# Patient Record
Sex: Female | Born: 2000 | Race: White | Hispanic: No | Marital: Single | Smoking: Never smoker
Health system: Southern US, Community
[De-identification: ages and names within clinical notes are randomized; demographics above are authoritative.]

## PROBLEM LIST (undated history)

## (undated) DIAGNOSIS — N289 Disorder of kidney and ureter, unspecified: Secondary | ICD-10-CM

## (undated) DIAGNOSIS — U071 COVID-19: Secondary | ICD-10-CM

## (undated) HISTORY — PX: WISDOM TOOTH EXTRACTION: SHX21

---
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
## 2019-07-04 ENCOUNTER — Encounter (HOSPITAL_COMMUNITY): Payer: Self-pay | Admitting: Emergency Medicine

## 2019-07-04 ENCOUNTER — Other Ambulatory Visit: Payer: Self-pay

## 2019-07-04 ENCOUNTER — Emergency Department (HOSPITAL_COMMUNITY)
Admission: EM | Admit: 2019-07-04 | Discharge: 2019-07-04 | Disposition: A | Discharge status: Home / Self Care | Payer: 59 | Attending: Emergency Medicine | Admitting: Emergency Medicine

## 2019-07-04 ENCOUNTER — Emergency Department (HOSPITAL_COMMUNITY): Payer: 59

## 2019-07-04 DIAGNOSIS — R0781 Pleurodynia: Secondary | ICD-10-CM | POA: Insufficient documentation

## 2019-07-04 DIAGNOSIS — R0602 Shortness of breath: Secondary | ICD-10-CM | POA: Diagnosis present

## 2019-07-04 DIAGNOSIS — Z793 Long term (current) use of hormonal contraceptives: Secondary | ICD-10-CM | POA: Insufficient documentation

## 2019-07-04 DIAGNOSIS — U071 COVID-19: Secondary | ICD-10-CM | POA: Insufficient documentation

## 2019-07-04 HISTORY — DX: COVID-19: U07.1

## 2019-07-04 HISTORY — DX: Disorder of kidney and ureter, unspecified: N28.9

## 2019-07-04 LAB — BASIC METABOLIC PANEL
Anion gap: 9 (ref 5–15)
BUN: 7 mg/dL (ref 6–20)
CO2: 23 mmol/L (ref 22–32)
Calcium: 8.9 mg/dL (ref 8.9–10.3)
Chloride: 105 mmol/L (ref 98–111)
Creatinine, Ser: 1.01 mg/dL — ABNORMAL HIGH (ref 0.44–1.00)
GFR calc Af Amer: >60 mL/min (ref >60)
GFR calc non Af Amer: >60 mL/min (ref >60)
Glucose, Bld: 93 mg/dL (ref 70–99)
Potassium: 4.2 mmol/L (ref 3.5–5.1)
Sodium: 137 mmol/L (ref 135–145)

## 2019-07-04 LAB — CBC
HCT: 45 % (ref 36.0–46.0)
Hemoglobin: 15.2 g/dL — ABNORMAL HIGH (ref 12.0–15.0)
MCH: 29.2 pg (ref 26.0–34.0)
MCHC: 33.8 g/dL (ref 30.0–36.0)
MCV: 86.4 fL (ref 80.0–100.0)
Platelets: 246 10*3/uL (ref 150–400)
RBC: 5.21 MIL/uL — ABNORMAL HIGH (ref 3.87–5.11)
RDW: 12.5 % (ref 11.5–15.5)
WBC: 2.9 10*3/uL — ABNORMAL LOW (ref 4.0–10.5)
nRBC: 0 % (ref 0.0–0.2)

## 2019-07-04 LAB — D-DIMER, QUANTITATIVE: D-Dimer, Quant: 1.32 ug/mL-FEU — ABNORMAL HIGH (ref 0.00–0.50)

## 2019-07-04 LAB — I-STAT BETA HCG BLOOD, ED (MC, WL, AP ONLY): I-stat hCG, quantitative: <5 m[IU]/mL (ref <5)

## 2019-07-04 MED ORDER — SODIUM CHLORIDE 0.9% FLUSH: 3.0000 mL | Freq: Once | INTRAVENOUS | Status: DC

## 2019-07-04 MED ORDER — IOHEXOL 350 MG/ML SOLN
80.0000 mL | Freq: Once | INTRAVENOUS | Status: AC | PRN
  Administered 2019-07-04: 18:00:00 80 mL via INTRAVENOUS

## 2019-07-04 NOTE — ED Provider Notes (Signed)
[facility] EMERGENCY DEPARTMENT Provider Note   CSN: [id number] Arrival date & time: 07/04/19  1300     History   Chief Complaint Chief Complaint  Patient presents with   covid/CP   Shortness of Breath    HPI Kristy Mueller is a 18 y.o. female with no significant past medical history presents to the ED with complaints of intermittent sharp chest pain associated with inspiration.  She has been symptomatic of COVID-19 x3 days with reported cough and chest pains.  She spoke with her on-call doctor at her PCPs office today and they instructed her to go to the ED for evaluation given her pleuritic chest pain.  She states that she has been taking Robitussin, Mucinex, and Tylenol, with good effect.  She denies any history of clots, clotting disorder, recent immobilization or travel, leg swelling, fevers or chills, or respiratory distress.  She does however take oral contraceptives with estrogen.      HPI  Past Medical History:  Diagnosis Date   COVID-19    Kidney disorder     Patient Active Problem List   Diagnosis Date Noted   Influenza due to identified novel influenza A virus with other respiratory manifestations 06/06/2016    Past Surgical History:  Procedure Laterality Date   WISDOM TOOTH EXTRACTION       OB History   No obstetric history on file.      Home Medications    Prior to Admission medications   Medication Sig Start Date End Date Taking? Authorizing Provider  Levonorgestrel-Ethinyl Estrad (ALTAVERA PO) Take 1 tablet by mouth daily.    Yes [provider]  oxybutynin (DITROPAN-XL) 5 MG 24 hr tablet Take 5 mg by mouth daily.   Yes [provider]  tamsulosin (FLOMAX) 0.4 MG CAPS capsule Take 0.4 mg by mouth daily. 05/31/19  Yes [provider]    Family History No family history on file.  Social History Social History   Tobacco Use   Smoking status: Never Smoker   Smokeless tobacco: Never Used   Substance Use Topics   Alcohol use: Never    Frequency: Never   Drug use: Never     Allergies   Tape   Review of Systems Review of Systems  All other systems reviewed and are negative.    Physical Exam Updated Vital Signs BP (!) 118/97    Pulse 100    Temp (!) 97.4 F (36.3 C) (Oral)    Resp 18    SpO2 97%   Physical Exam Vitals signs and nursing note reviewed. Exam conducted with a chaperone present.  Constitutional:      Appearance: Normal appearance.  HENT:     Head: Normocephalic and atraumatic.  Eyes:     General: No scleral icterus.    Conjunctiva/sclera: Conjunctivae normal.  Neck:     Musculoskeletal: Normal range of motion and neck supple.  Cardiovascular:     Rate and Rhythm: Normal rate and regular rhythm.     Pulses: Normal pulses.     Heart sounds: Normal heart sounds.  Pulmonary:     Effort: Pulmonary effort is normal. No respiratory distress.     Breath sounds: Normal breath sounds. No wheezing or rales.  Abdominal:     General: Abdomen is flat. There is no distension.     Palpations: Abdomen is soft.     Tenderness: There is no abdominal tenderness. There is no guarding.  Skin:    General: Skin  is dry.  Neurological:     Mental Status: She is alert.     GCS: GCS eye subscore is 4. GCS verbal subscore is 5. GCS motor subscore is 6.  Psychiatric:        Mood and Affect: Mood normal.        Behavior: Behavior normal.        Thought Content: Thought content normal.      ED Treatments / Results  Labs (all labs ordered are listed, but only abnormal results are displayed) Labs Reviewed  BASIC METABOLIC PANEL - Abnormal; Notable for the following components:      Result Value   Creatinine, Ser 1.01 (*)    All other components within normal limits  CBC - Abnormal; Notable for the following components:   WBC 2.9 (*)    RBC 5.21 (*)    Hemoglobin 15.2 (*)    All other components within normal limits  D-DIMER, QUANTITATIVE (NOT AT [facility]) -  Abnormal; Notable for the following components:   D-Dimer, Quant 1.32 (*)    All other components within normal limits  I-STAT BETA HCG BLOOD, ED ([facility], [facility], [facility] ONLY)    EKG EKG Interpretation  Date/Time:  Sunday July 04 2019 13:13:46 EST Ventricular Rate:  111 PR Interval:  120 QRS Duration: 72 QT Interval:  306 QTC Calculation: 416 R Axis:   87 Text Interpretation: Sinus tachycardia Otherwise normal ECG Confirmed by Bethann Berkshire [phone]) on 07/04/2019 4:38:05 PM   Radiology Ct Angio Chest Pe W And/or Wo Contrast  Result Date: 07/04/2019 CLINICAL DATA:  Chest pain with deep inspiration, pre syncopal feeling, shortness of breath, nonproductive cough, elevated D-dimer, COVID-19 positive, intermediate clinical probability for pulmonary embolism EXAM: CT ANGIOGRAPHY CHEST WITH CONTRAST TECHNIQUE: Multidetector CT imaging of the chest was performed using the standard protocol during bolus administration of intravenous contrast. Multiplanar CT image reconstructions and MIPs were obtained to evaluate the vascular anatomy. CONTRAST:  63mL OMNIPAQUE IOHEXOL 350 MG/ML SOLN IV COMPARISON:  None FINDINGS: Cardiovascular: Aorta normal caliber without aneurysm or dissection. Heart unremarkable. No pericardial effusion. Pulmonary arteries adequately opacified and patent. No evidence of pulmonary embolism. Mediastinum/Nodes: Base of cervical region normal appearance. Residual thymic tissue in anterior mediastinum. Esophagus unremarkable. No thoracic adenopathy. Lungs/Pleura: Patchy BILATERAL ground-glass pulmonary infiltrates consistent with pneumonia and history of COVID-19. No pleural effusion or pneumothorax. Upper Abdomen: Mild splenic prominence. Remaining visualized upper abdomen unremarkable. Musculoskeletal: No acute osseous findings. Review of the MIP images confirms the above findings. IMPRESSION: No evidence of pulmonary embolism. Patchy BILATERAL ground-glass pulmonary infiltrates consistent with  pneumonia and history of COVID-19. Electronically Signed   By: Ulyses Southward M.D.   On: 07/04/2019 18:23   Dg Chest Portable 1 View  Result Date: 07/04/2019 CLINICAL DATA:  Chest pain, shortness of breath and positive COVID-19 test. EXAM: PORTABLE CHEST 1 VIEW COMPARISON:  06/04/2016 FINDINGS: The cardiac silhouette, mediastinal and hilar contours are normal. The lungs are clear. No infiltrates or effusions. The bony thorax is intact. IMPRESSION: No acute pulmonary findings. Electronically Signed   By: Rudie Meyer M.D.   On: 07/04/2019 14:35    Procedures Procedures (including critical care time)  Medications Ordered in ED Medications  sodium chloride flush (NS) 0.9 % injection 3 mL (has no administration in time range)  iohexol (OMNIPAQUE) 350 MG/ML injection 80 mL (80 mLs Intravenous Contrast Given 07/04/19 1753)     Initial Impression / Assessment and Plan / ED Course  I have reviewed the  triage vital signs and the nursing notes.  Pertinent labs & imaging results that were available during my care of the patient were reviewed by me and considered in my medical decision making (see chart for details).        DG chest was obtained and personally reviewed which demonstrates no evidence of focal consolidation or atypical pneumonia.  Also no evidence of a pneumothorax that might explain her pleuritic chest pain.  While she is low risk for PE, she is not PERC negative as she takes oral contraceptives.  She also tested positive for COVID-19 which is a prothrombotic infection.  Will obtain D-dimer and if positive will proceed with CTA.  The D-dimer might be elevated due to the COVID-19 infection, but also might not.  Patient had an elevated D-dimer so we proceeded with CTA.  CTA was reviewed personally and by radiologist and demonstrates no evidence of a pulmonary embolism, but rather evidence of patchy infiltrates consistent with a COVID-19 pneumonia.  Given that patient continues to be  neither tachycardic, tachypneic, or hypoxic, it is reasonable for her to be discharged home.  Continue over-the-counter medications for symptomatic relief, as needed.  Emphasized the importance of increased oral hydration.  Please return to the ED or seek medical attention immediately should you develop any difficulty breathing, new or worsening chest pain, dizziness or lightheadedness, uncontrolled nausea or vomiting, inability to tolerate food or liquid by mouth, fevers or chills uncontrolled by Tylenol, or any other new or worsening symptoms.   Final Clinical Impressions(s) / ED Diagnoses   Final diagnoses:  COVID-19    ED Discharge Orders    None       Elvera MariaGreen, Arlester Keehan L, PA-C 07/04/19 Kerrin [id number]    Zammit, Joseph, MD 07/05/19 1002

## 2019-07-04 NOTE — ED Notes (Signed)
Patient transported to radiology

## 2019-07-04 NOTE — ED Notes (Signed)
Provider at bedside

## 2019-07-04 NOTE — Discharge Instructions (Signed)
You have patchy infiltrates on your CT scan that are consistent with a atypical pneumonia such as 1 caused by COVID-19.  Please continue over-the-counter medications for symptomatic relief, as needed.  Use Tylenol or ibuprofen should you develop any fever or chills.  Please return to the ED or seek medical attention immediately should you  develop any difficulty breathing, new or worsening chest pain, dizziness or lightheadedness, uncontrolled nausea or vomiting, inability to tolerate food or liquid by mouth, fevers or chills uncontrolled by Tylenol, or any other new or worsening symptoms.   If you live with, or provide care at home for, a person confirmed to have, or being evaluated for, COVID-19 infection please follow these guidelines to prevent infection:  Follow healthcare providers instructions Make sure that you understand and can help the patient follow any healthcare provider instructions for all care.  Provide for the patients basic needs You should help the patient with basic needs in the home and provide support for getting groceries, prescriptions, and other personal needs.  Monitor the patients symptoms If they are getting sicker, call his or her medical provider a  This will help the healthcare providers office take steps to keep other people from getting infected. Ask the healthcare provider to call the local or state health department.  Limit the number of people who have contact with the patient If possible, have only one caregiver for the patient. Other household members should stay in another home or place of residence. If this is not possible, they should stay in another room, or be separated from the patient as much as possible. Use a separate bathroom, if available. Restrict visitors who do not have an essential need to be in the home.  Keep older adults, very young children, and other sick people away from the patient Keep older adults, very young children, and  those who have compromised immune systems or chronic health conditions away from the patient. This includes people with chronic heart, lung, or kidney conditions, diabetes, and cancer.  Ensure good ventilation Make sure that shared spaces in the home have good air flow, such as from an air conditioner or an opened window, weather permitting.  Wash your hands often Wash your hands often and thoroughly with soap and water for at least 20 seconds. You can use an alcohol based hand sanitizer if soap and water are not available and if your hands are not visibly dirty. Avoid touching your eyes, nose, and mouth with unwashed hands. Use disposable paper towels to dry your hands. If not available, use dedicated cloth towels and replace them when they become wet.  Wear a facemask and gloves Wear a disposable facemask at all times in the room and gloves when you touch or have contact with the patients blood, body fluids, and/or secretions or excretions, such as sweat, saliva, sputum, nasal mucus, vomit, urine, or feces.  Ensure the mask fits over your nose and mouth tightly, and do not touch it during use. Throw out disposable facemasks and gloves after using them. Do not reuse. Wash your hands immediately after removing your facemask and gloves. If your personal clothing becomes contaminated, carefully remove clothing and launder. Wash your hands after handling contaminated clothing. Place all used disposable facemasks, gloves, and other waste in a lined container before disposing them with other household waste. Remove gloves and wash your hands immediately after handling these items.  Do not share dishes, glasses, or other household items with the patient Avoid sharing household items.  You should not share dishes, drinking glasses, cups, eating utensils, towels, bedding, or other items After the person uses these items, you should wash them thoroughly with soap and water.  Wash laundry  thoroughly Immediately remove and wash clothes or bedding that have blood, body fluids, and/or secretions or excretions, such as sweat, saliva, sputum, nasal mucus, vomit, urine, or feces, on them. Wear gloves when handling laundry from the patient. Read and follow directions on labels of laundry or clothing items and detergent. In general, wash and dry with the warmest temperatures recommended on the label.  Clean all areas the individual has used often Clean all touchable surfaces, such as counters, tabletops, doorknobs, bathroom fixtures, toilets, phones, keyboards, tablets, and bedside tables, every day. Also, clean any surfaces that may have blood, body fluids, and/or secretions or excretions on them. Wear gloves when cleaning surfaces the patient has come in contact with. Use a diluted bleach solution (e.g., dilute bleach with 1 part bleach and 10 parts water) or a household disinfectant with a label that says EPA-registered for coronaviruses. To make a bleach solution at home, add 1 tablespoon of bleach to 1 quart (4 cups) of water. For a larger supply, add  cup of bleach to 1 gallon (16 cups) of water. Read labels of cleaning products and follow recommendations provided on product labels. Labels contain instructions for safe and effective use of the cleaning product including precautions you should take when applying the product, such as wearing gloves or eye protection and making sure you have good ventilation during use of the product. Remove gloves and wash hands immediately after cleaning.  Monitor yourself for signs and symptoms of illness Caregivers and household members are considered close contacts, should monitor their health, and will be asked to limit movement outside of the home to the extent possible. Follow the monitoring steps for close contacts listed on the symptom monitoring form.   ? If you have additional questions, contact your local health department or call the  epidemiologist on call at [phone] (available 24/7). ? This guidance is subject to change. For the most up-to-date guidance from [facility], please refer to their website: [email or web address]

## 2019-07-04 NOTE — ED Triage Notes (Signed)
+   COVID results today.  Pt states she feels like she is going to pass out.  Reports this morning she started to have chest pain with deep inspiration, SOB, non-productive cough.  Denies fever.

## 2019-07-04 NOTE — ED Notes (Signed)
Called lab able to add D-dimer to available blood.  

## 2019-07-04 NOTE — ED Notes (Signed)
Patient returned from CT

## 2019-12-24 IMAGING — DX DG CHEST 1V PORT
1 series · 1 of 1 positions shown · non-contrast
Comparison: 06/04/2016

CLINICAL DATA: Chest pain, shortness of breath and positive
[id number] test.

EXAM:
PORTABLE CHEST 1 VIEW

[chest]
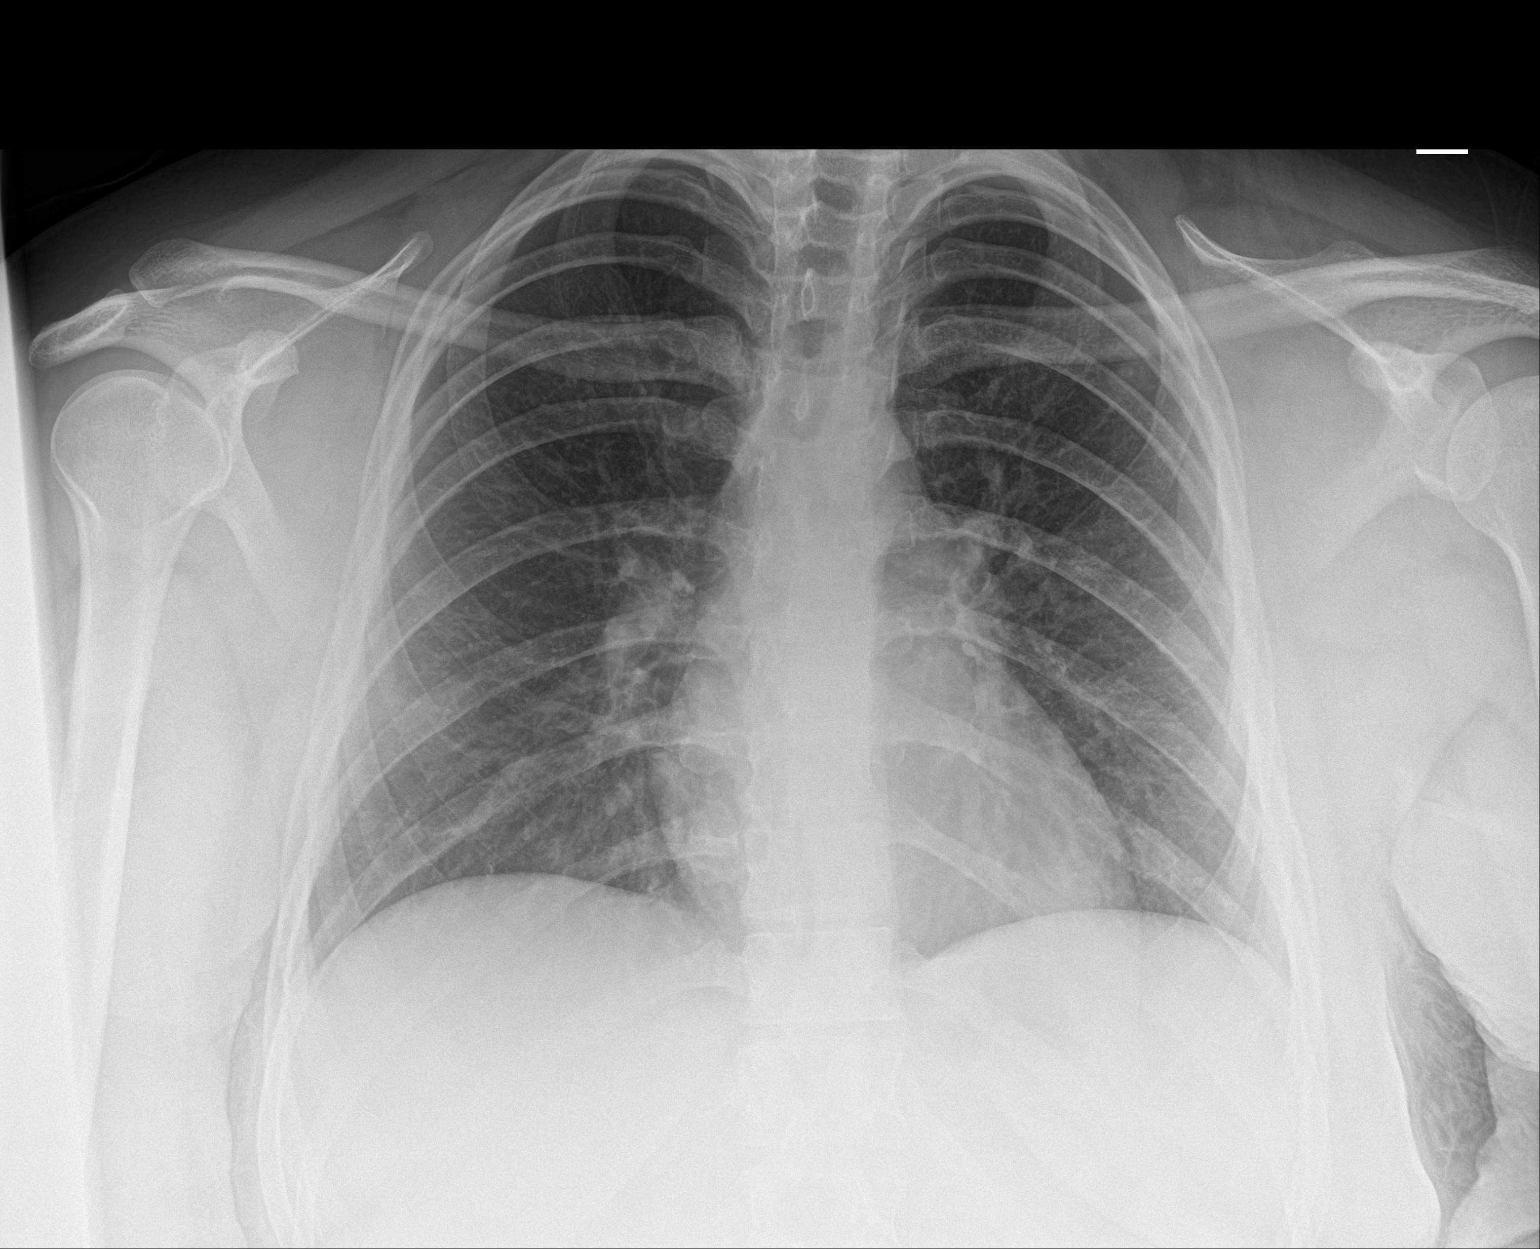

[1 of 1 positions shown; findings below may reference images not displayed]

FINDINGS: The cardiac silhouette, mediastinal and hilar contours are normal.
The lungs are clear. No infiltrates or effusions. The bony thorax is
intact.
IMPRESSION: No acute pulmonary findings.

## 2019-12-24 IMAGING — CT CT ANGIO CHEST
2 of 6 series · 18 of 46 positions shown · IV contrast (omnipaque)
Comparison: None

CLINICAL DATA: Chest pain with deep inspiration, pre syncopal
feeling, shortness of breath, nonproductive cough, elevated D-dimer,
ER7NY-W5 positive, intermediate clinical probability for pulmonary
embolism

EXAM:
CT ANGIOGRAPHY CHEST WITH CONTRAST
TECHNIQUE: Multidetector CT imaging of the chest was performed using the
standard protocol during bolus administration of intravenous
contrast. Multiplanar CT image reconstructions and MIPs were
obtained to evaluate the vascular anatomy.
CONTRAST:  80mL OMNIPAQUE IOHEXOL 350 MG/ML SOLN IV

[Series 6: thins · axial · 0.63mm/px · z∈[+920,+1160]mm · 15 of 264 slices shown]
[im 12/264  lung]
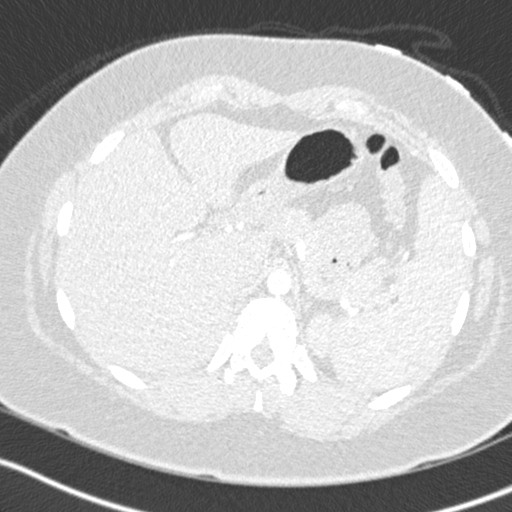
[im 35/264  soft-tissue]
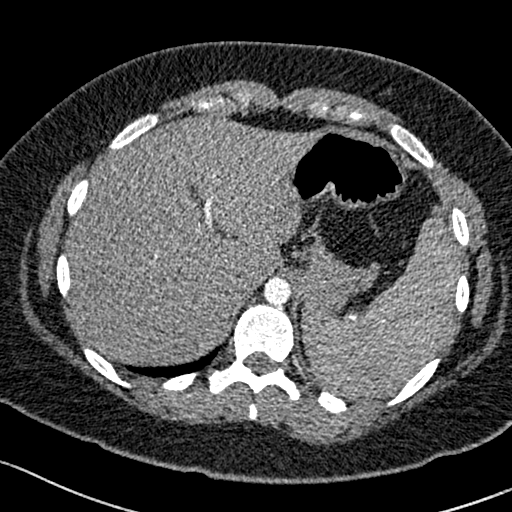
[im 46/264  lung]
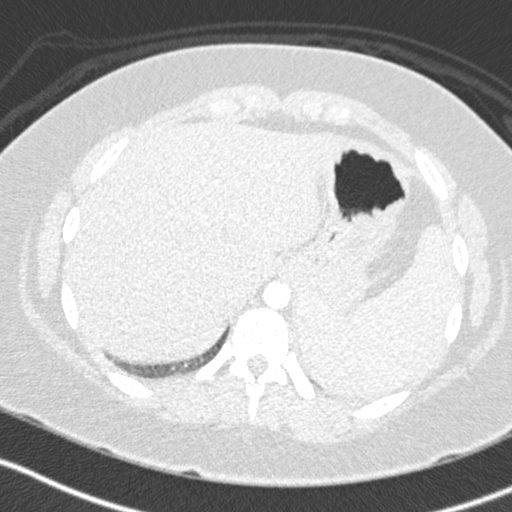
[im 69/264  soft-tissue]
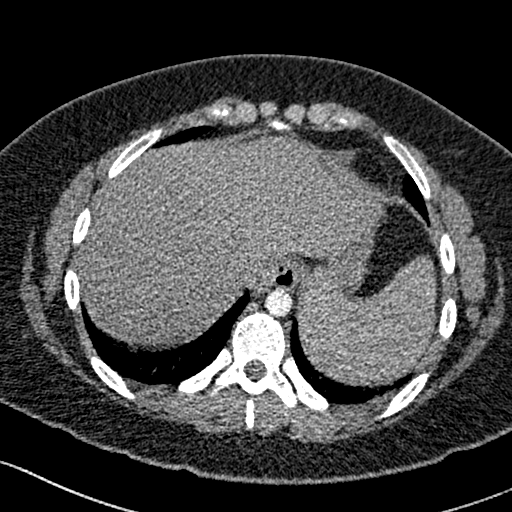
[im 81/264  lung]
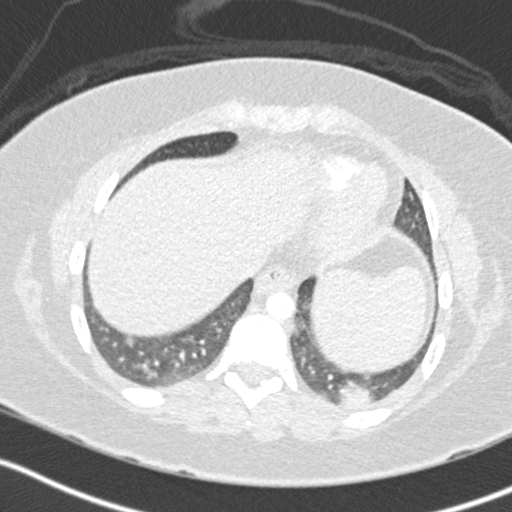
[im 103/264  soft-tissue]
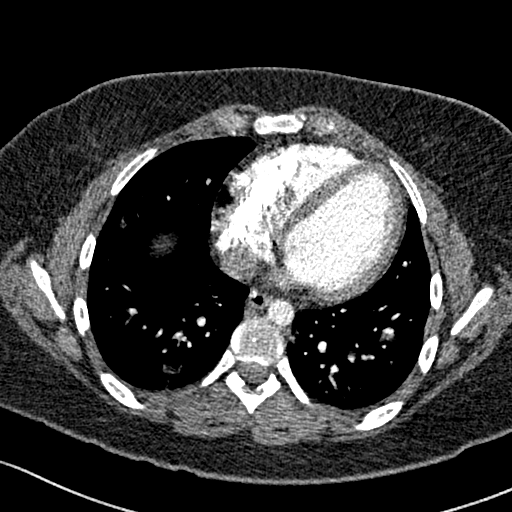
[im 115/264  lung]
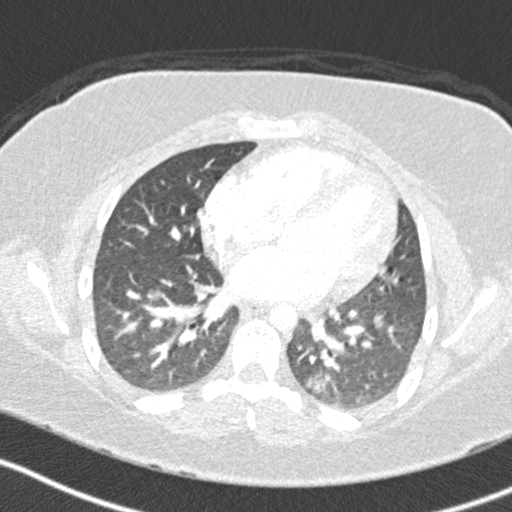
[im 138/264  soft-tissue]
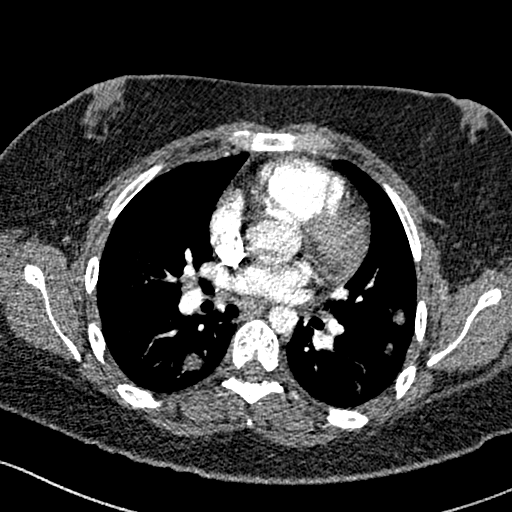
[im 149/264  lung]
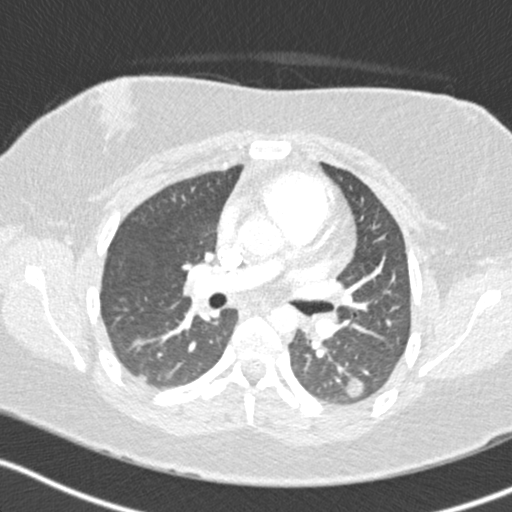
[im 161/264  soft-tissue]
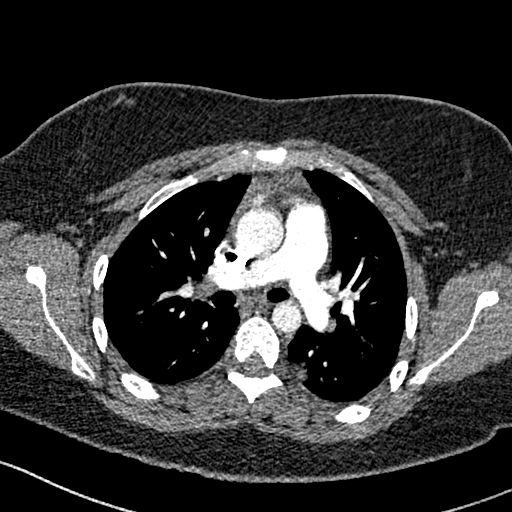
[im 183/264  lung]
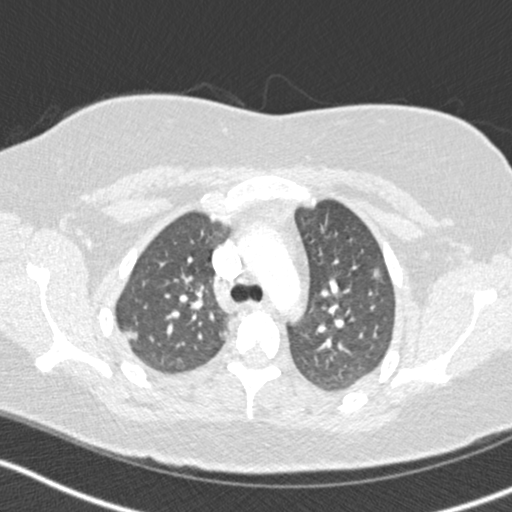
[im 195/264  soft-tissue]
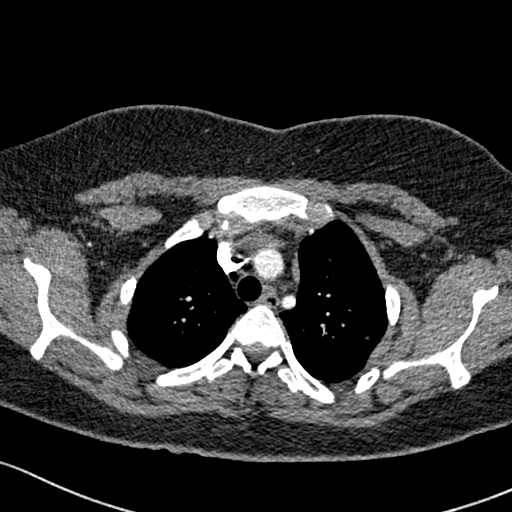
[im 218/264  lung]
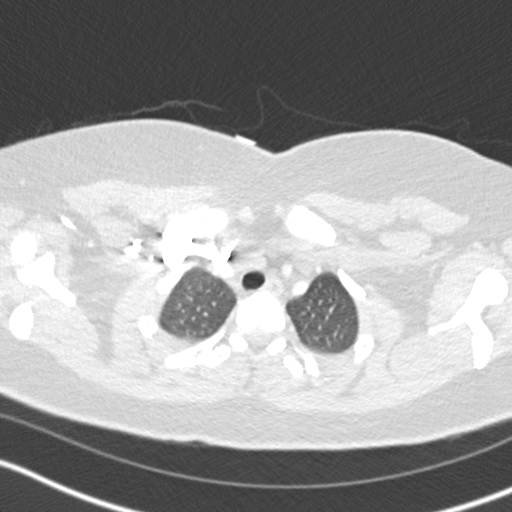
[im 229/264  soft-tissue]
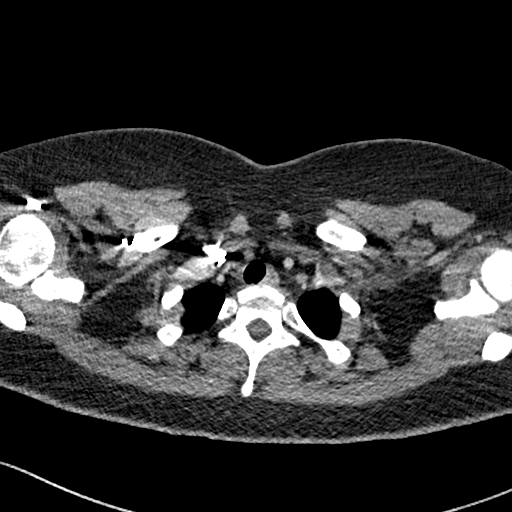
[im 252/264  lung]
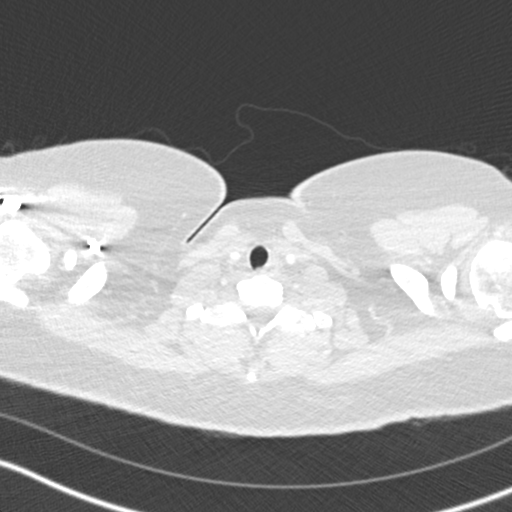

[Series 8: coronal mpr · coronal · 0.56mm/px · 3 of 117 slices shown]
[im 30/117  soft-tissue]
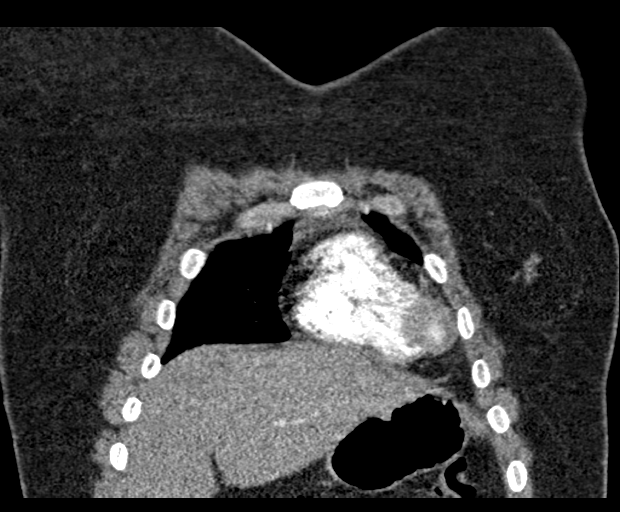
[im 59/117  soft-tissue]
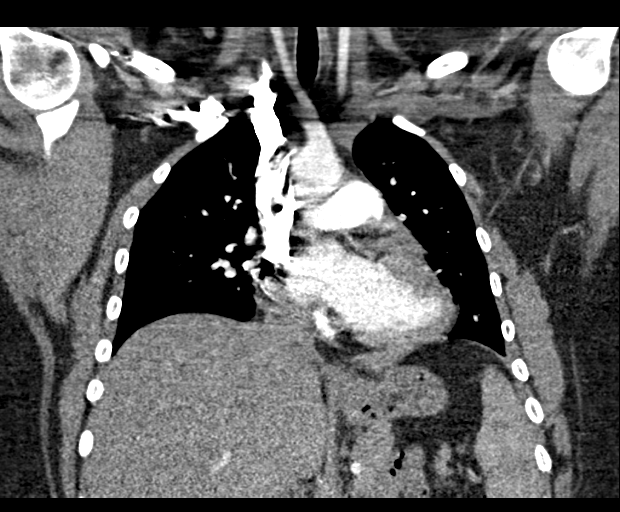
[im 88/117  soft-tissue]
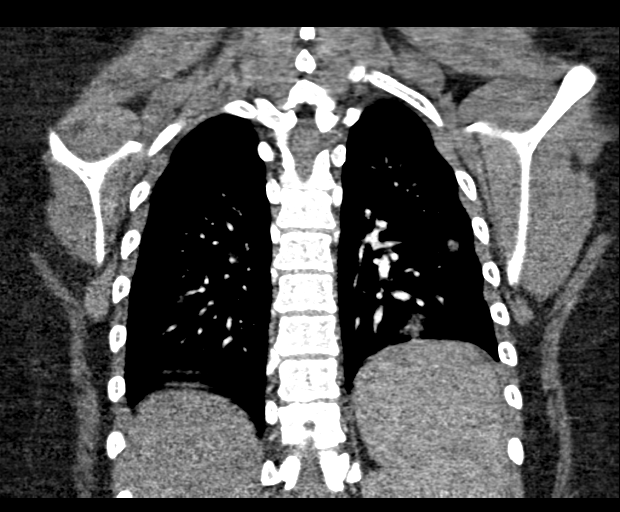

[18 of 46 positions shown; findings below may reference images not displayed]

FINDINGS: Cardiovascular: Aorta normal caliber without aneurysm or dissection.
Heart unremarkable. No pericardial effusion. Pulmonary arteries
adequately opacified and patent. No evidence of pulmonary embolism.

Mediastinum/Nodes: Base of cervical region normal appearance.
Residual thymic tissue in anterior mediastinum. Esophagus
unremarkable. No thoracic adenopathy.

Lungs/Pleura: Patchy BILATERAL ground-glass pulmonary infiltrates
consistent with pneumonia and history of ER7NY-W5. No pleural
effusion or pneumothorax.

Upper Abdomen: Mild splenic prominence. Remaining visualized upper
abdomen unremarkable.

Musculoskeletal: No acute osseous findings.

Review of the MIP images confirms the above findings.
IMPRESSION: No evidence of pulmonary embolism.

Patchy BILATERAL ground-glass pulmonary infiltrates consistent with
pneumonia and history of ER7NY-W5.

## 2020-10-30 ENCOUNTER — Emergency Department (HOSPITAL_BASED_OUTPATIENT_CLINIC_OR_DEPARTMENT_OTHER)
Admission: EM | Admit: 2020-10-30 | Discharge: 2020-10-30 | Disposition: A | Discharge status: Home / Self Care | Payer: 59 | Attending: Emergency Medicine | Admitting: Emergency Medicine

## 2020-10-30 ENCOUNTER — Other Ambulatory Visit: Payer: Self-pay

## 2020-10-30 ENCOUNTER — Encounter (HOSPITAL_BASED_OUTPATIENT_CLINIC_OR_DEPARTMENT_OTHER): Payer: Self-pay

## 2020-10-30 ENCOUNTER — Emergency Department (HOSPITAL_BASED_OUTPATIENT_CLINIC_OR_DEPARTMENT_OTHER): Payer: 59

## 2020-10-30 DIAGNOSIS — R110 Nausea: Secondary | ICD-10-CM | POA: Diagnosis not present

## 2020-10-30 DIAGNOSIS — R1031 Right lower quadrant pain: Secondary | ICD-10-CM | POA: Diagnosis present

## 2020-10-30 DIAGNOSIS — N289 Disorder of kidney and ureter, unspecified: Secondary | ICD-10-CM | POA: Insufficient documentation

## 2020-10-30 DIAGNOSIS — Z79899 Other long term (current) drug therapy: Secondary | ICD-10-CM | POA: Insufficient documentation

## 2020-10-30 LAB — CBC WITH DIFFERENTIAL/PLATELET
Abs Immature Granulocytes: 0.04 10*3/uL (ref 0.00–0.07)
Basophils Absolute: 0.1 10*3/uL (ref 0.0–0.1)
Basophils Relative: 1 %
Eosinophils Absolute: 0.1 10*3/uL (ref 0.0–0.5)
Eosinophils Relative: 1 %
HCT: 43.7 % (ref 36.0–46.0)
Hemoglobin: 15.1 g/dL — ABNORMAL HIGH (ref 12.0–15.0)
Immature Granulocytes: 0 %
Lymphocytes Relative: 20 %
Lymphs Abs: 2 10*3/uL (ref 0.7–4.0)
MCH: 29.1 pg (ref 26.0–34.0)
MCHC: 34.6 g/dL (ref 30.0–36.0)
MCV: 84.2 fL (ref 80.0–100.0)
Monocytes Absolute: 0.5 10*3/uL (ref 0.1–1.0)
Monocytes Relative: 5 %
Neutro Abs: 7.3 10*3/uL (ref 1.7–7.7)
Neutrophils Relative %: 73 %
Platelets: 330 10*3/uL (ref 150–400)
RBC: 5.19 MIL/uL — ABNORMAL HIGH (ref 3.87–5.11)
RDW: 12.2 % (ref 11.5–15.5)
WBC: 10.1 10*3/uL (ref 4.0–10.5)
nRBC: 0 % (ref 0.0–0.2)

## 2020-10-30 LAB — COMPREHENSIVE METABOLIC PANEL
ALT: 19 U/L (ref 0–44)
AST: 20 U/L (ref 15–41)
Albumin: 4 g/dL (ref 3.5–5.0)
Alkaline Phosphatase: 37 U/L — ABNORMAL LOW (ref 38–126)
Anion gap: 11 (ref 5–15)
BUN: 12 mg/dL (ref 6–20)
CO2: 20 mmol/L — ABNORMAL LOW (ref 22–32)
Calcium: 9.2 mg/dL (ref 8.9–10.3)
Chloride: 105 mmol/L (ref 98–111)
Creatinine, Ser: 0.77 mg/dL (ref 0.44–1.00)
GFR, Estimated: >60 mL/min (ref >60)
Glucose, Bld: 84 mg/dL (ref 70–99)
Potassium: 4 mmol/L (ref 3.5–5.1)
Sodium: 136 mmol/L (ref 135–145)
Total Bilirubin: 0.5 mg/dL (ref 0.3–1.2)
Total Protein: 7.4 g/dL (ref 6.5–8.1)

## 2020-10-30 LAB — URINALYSIS, ROUTINE W REFLEX MICROSCOPIC
Bilirubin Urine: NEGATIVE
Glucose, UA: NEGATIVE mg/dL
Hgb urine dipstick: NEGATIVE
Ketones, ur: NEGATIVE mg/dL
Nitrite: NEGATIVE
Protein, ur: NEGATIVE mg/dL
Specific Gravity, Urine: 1.025 (ref 1.005–1.030)
pH: 6 (ref 5.0–8.0)

## 2020-10-30 LAB — URINALYSIS, MICROSCOPIC (REFLEX): RBC / HPF: NONE SEEN RBC/hpf (ref 0–5)

## 2020-10-30 LAB — PREGNANCY, URINE: Preg Test, Ur: NEGATIVE

## 2020-10-30 LAB — LIPASE, BLOOD: Lipase: 25 U/L (ref 11–51)

## 2020-10-30 MED ORDER — ONDANSETRON HCL 4 MG/2ML IJ SOLN
4.0000 mg | Freq: Once | INTRAMUSCULAR | Status: AC
  Administered 2020-10-30: 4 mg via INTRAVENOUS
  Filled 2020-10-30: qty 2

## 2020-10-30 MED ORDER — SODIUM CHLORIDE 0.9 % IV BOLUS
1000.0000 mL | Freq: Once | INTRAVENOUS | Status: AC
  Administered 2020-10-30: 1000 mL via INTRAVENOUS

## 2020-10-30 MED ORDER — IOHEXOL 300 MG/ML  SOLN
100.0000 mL | Freq: Once | INTRAMUSCULAR | Status: AC | PRN
  Administered 2020-10-30: 100 mL via INTRAVENOUS

## 2020-10-30 MED ORDER — DICYCLOMINE HCL 20 MG PO TABS: 20.0000 mg | ORAL_TABLET | Freq: Two times a day (BID) | ORAL | 0 refills | Status: AC

## 2020-10-30 MED ORDER — MORPHINE SULFATE (PF) 4 MG/ML IV SOLN
4.0000 mg | Freq: Once | INTRAVENOUS | Status: AC
Start: 2020-10-30 — End: 2020-10-30
  Administered 2020-10-30: 4 mg via INTRAVENOUS
  Filled 2020-10-30: qty 1

## 2020-10-30 NOTE — ED Notes (Signed)
Patient transported to CT 

## 2020-10-30 NOTE — ED Triage Notes (Signed)
Pt arrives with right lower abdominal pain, sometimes radiating to her flank and right leg with nausea, denies vomiting, denies diarrhea, denies fevers.

## 2020-10-30 NOTE — Discharge Instructions (Signed)
Your CT scan showed a normal appendix without appendicitis.  There was no abnormalities found on CT scan.  Your lab work was reassuring.  Please follow-up with your primary care doctor and OB/GYN.  Use the Bentyl as needed for pain along with Tylenol 1000 mg every 6 hours.  Drink plenty of water you may ice return to the ER for any new or concerning symptoms.

## 2020-10-30 NOTE — ED Provider Notes (Signed)
[facility] EMERGENCY DEPARTMENT Provider Note   CSN: [id number] Arrival date & time: 10/30/20  1607     History Chief Complaint  Patient presents with  . Abdominal Pain    Kristy Mueller is a 20 y.o. female.  HPI Patient is a 20 year old female with past medical history significant for COVID-19 no other significant past medical history but she presented today for  Right lower quadrant abdominal pain that has been ongoing since Saturday.  She states that it is achy seems to wax and wane but has gotten progressively worse.  She states that it is located in her right lower quadrant of her abdomen.  She endorses nausea without vomiting.  She states that she has had normal bowel movements no melena hematochezia.  She denies any recent alcohol use, recreational drug use or significant NSAID intake.  She states that she was seen 12 days ago and had an ultrasound at her OB/GYN office and was told that she did not have any cyst this is been a recurrent issue for her in the past however.  She does her symptoms were gradual in onset.  She states they are nonradiating.  She denies any urinary frequency, urgency, dysuria, hematuria.  She denies any other associate symptoms including fevers or chills.  She does include that she is having decreased appetite.  Has any vaginal bleeding, any vaginal discharge or discomfort.    Past Medical History:  Diagnosis Date  . COVID-19   . Kidney disorder     Patient Active Problem List   Diagnosis Date Noted  . Influenza due to identified novel influenza A virus with other respiratory manifestations 06/06/2016    Past Surgical History:  Procedure Laterality Date  . WISDOM TOOTH EXTRACTION       OB History   No obstetric history on file.     No family history on file.  Social History   Tobacco Use  . Smoking status: Never Smoker  . Smokeless tobacco: Never Used  Substance Use Topics  . Alcohol use: Never  . Drug use: Never     Home Medications Prior to Admission medications   Medication Sig Start Date End Date Taking? Authorizing Provider  dicyclomine (BENTYL) 20 MG tablet Take 1 tablet (20 mg total) by mouth 2 (two) times daily. 10/30/20  Yes Phenix Vandermeulen, Rodrigo Ran, PA  Levonorgestrel-Ethinyl Estrad (ALTAVERA PO) Take 1 tablet by mouth daily.     [provider]  oxybutynin (DITROPAN-XL) 5 MG 24 hr tablet Take 5 mg by mouth daily.    [provider]  tamsulosin (FLOMAX) 0.4 MG CAPS capsule Take 0.4 mg by mouth daily. 05/31/19   [provider]    Allergies    Tape  Review of Systems   Review of Systems  Constitutional: Negative for chills and fever.  HENT: Negative for congestion.   Eyes: Negative for pain.  Respiratory: Negative for cough and shortness of breath.   Cardiovascular: Negative for chest pain and leg swelling.  Gastrointestinal: Positive for abdominal pain and nausea. Negative for constipation, diarrhea and vomiting.  Genitourinary: Negative for dysuria, frequency, hematuria, vaginal bleeding, vaginal discharge and vaginal pain.  Musculoskeletal: Negative for myalgias.  Skin: Negative for rash.  Neurological: Negative for dizziness and headaches.    Physical Exam Updated Vital Signs BP (!) 108/54   Pulse 71   Temp 98.3 F (36.8 C) (Oral)   Resp 18   Ht 5\' 6"  (1.676 m)   Wt 104.3 kg  SpO2 99%   BMI 37.12 kg/m   Physical Exam Vitals and nursing note reviewed.  Constitutional:      General: She is not in acute distress. HENT:     Head: Normocephalic and atraumatic.     Nose: Nose normal.  Eyes:     General: No scleral icterus. Cardiovascular:     Rate and Rhythm: Normal rate and regular rhythm.     Pulses: Normal pulses.     Heart sounds: Normal heart sounds.  Pulmonary:     Effort: Pulmonary effort is normal. No respiratory distress.     Breath sounds: No wheezing.  Abdominal:     General: Bowel sounds are normal.     Palpations: Abdomen is  soft.     Tenderness: There is abdominal tenderness. There is no right CVA tenderness or left CVA tenderness. Positive signs include McBurney's sign. Negative signs include Rovsing's sign and psoas sign.     Comments: Abdomen is soft, obese, there is tenderness to palpation located just superior to McBurney's point.  There is rebound tenderness but no guarding.  Negative Rovsing and psoas.  There is mild tenderness to palpation of McBurney's point as well.  Genitourinary:    Comments: Deferred Musculoskeletal:     Cervical back: Normal range of motion.     Right lower leg: No edema.     Left lower leg: No edema.  Skin:    General: Skin is warm and dry.     Capillary Refill: Capillary refill takes less than 2 seconds.  Neurological:     Mental Status: She is alert. Mental status is at baseline.  Psychiatric:        Mood and Affect: Mood normal.        Behavior: Behavior normal.     ED Results / Procedures / Treatments   Labs (all labs ordered are listed, but only abnormal results are displayed) Labs Reviewed  URINALYSIS, ROUTINE W REFLEX MICROSCOPIC - Abnormal; Notable for the following components:      Result Value   APPearance HAZY (*)    Leukocytes,Ua MODERATE (*)    All other components within normal limits  URINALYSIS, MICROSCOPIC (REFLEX) - Abnormal; Notable for the following components:   Bacteria, UA FEW (*)    All other components within normal limits  CBC WITH DIFFERENTIAL/PLATELET - Abnormal; Notable for the following components:   RBC 5.19 (*)    Hemoglobin 15.1 (*)    All other components within normal limits  COMPREHENSIVE METABOLIC PANEL - Abnormal; Notable for the following components:   CO2 20 (*)    Alkaline Phosphatase 37 (*)    All other components within normal limits  PREGNANCY, URINE  LIPASE, BLOOD    EKG None  Radiology CT ABDOMEN PELVIS W CONTRAST  Result Date: 10/30/2020 CLINICAL DATA:  Right lower quadrant abdominal pain, nausea EXAM: CT  ABDOMEN AND PELVIS WITH CONTRAST TECHNIQUE: Multidetector CT imaging of the abdomen and pelvis was performed using the standard protocol following bolus administration of intravenous contrast. CONTRAST:  OMNIPAQUE IOHEXOL 300 MG/ML  SOLN COMPARISON:  None. FINDINGS: Lower chest: No acute pleural or parenchymal lung disease. Hepatobiliary: No focal liver abnormality is seen. No gallstones, gallbladder wall thickening, or biliary dilatation. Pancreas: Unremarkable. No pancreatic ductal dilatation or surrounding inflammatory changes. Spleen: Normal in size without focal abnormality. Adrenals/Urinary Tract: Adrenal glands are unremarkable. Kidneys are normal, without renal calculi, focal lesion, or hydronephrosis. Bladder is unremarkable. Stomach/Bowel: No bowel obstruction or ileus. There is  a normal appendix within the midline upper pelvis. No bowel wall thickening or inflammatory change. Vascular/Lymphatic: No significant vascular findings are present. No enlarged abdominal or pelvic lymph nodes. Reproductive: Uterus and bilateral adnexa are unremarkable. Other: No free fluid or free gas.  No abdominal wall hernia. Musculoskeletal: No acute or destructive bony lesions. Reconstructed images demonstrate no additional findings. IMPRESSION: 1. No acute intra-abdominal or intrapelvic process. Normal appendix. Electronically Signed   By: Sharlet Salina M.D.   On: 10/30/2020 20:03    Procedures Procedures   Medications Ordered in ED Medications  ondansetron (ZOFRAN) injection 4 mg (4 mg Intravenous Given 10/30/20 1840)  morphine 4 MG/ML injection 4 mg (4 mg Intravenous Given 10/30/20 1840)  sodium chloride 0.9 % bolus 1,000 mL (0 mLs Intravenous Stopped 10/30/20 1939)  iohexol (OMNIPAQUE) 300 MG/ML solution 100 mL (100 mLs Intravenous Contrast Given 10/30/20 1949)    ED Course  I have reviewed the triage vital signs and the nursing notes.  Pertinent labs & imaging results that were available during my  care of the patient were reviewed by me and considered in my medical decision making (see chart for details).  Patient is 20 year old female with past medical history detailed in HPI she is presented today for right lower quadrant abdominal pain.  Do have concern for appendicitis given the chronicity and gradual worsening.  She does have decreased appetite she does have focal right lower quadrant abdominal tenderness.  She has no pelvic complaints, she has recently had a ultrasound of her uterus and ovaries and was told that her cysts-which she has frequent episodes of-were gone at this time.  This was less than 2 weeks ago.  She is deferred pelvic examination.  She is primarily concerned about appendicitis as well.  We will obtain CT scan.  Patient has received  1 dose of morphine and IV fluids and Zofran.  Clinical Course as of 10/30/20 2335  Mon Oct 30, 2020  2031 Normal appendix on CT scan.  No acute intra-abdominal pathology described patient's symptoms.  She will follow closely with PCP. [WF]  2031 CBC without leukocytosis increase in hemoglobin consistent with dehydration.  She has received IV fluids.  CMP without abnormalities.  Urinalysis without evidence of infection.  Lipase within normal limits doubt pancreatitis. [WF]    Clinical Course User Index [WF] Gailen Shelter, [place]   MDM Rules/Calculators/A&P                          Patient p.o. challenged and had no difficulty keeping fluids down.  CT scan was reassuring.  Discussed with patient and her mother who is now at bedside.  They are agreeable to plan to discharge this time and have patient follow-up with PCP.  Return precautions given.  Final Clinical Impression(s) / ED Diagnoses Final diagnoses:  Right lower quadrant abdominal pain    Rx / DC Orders ED Discharge Orders         Ordered    dicyclomine (BENTYL) 20 MG tablet  2 times daily        10/30/20 2041           Solon Augusta [place], [place] 10/30/20 2335     Jacalyn Lefevre, MD 11/01/20 2352

## 2021-04-21 IMAGING — CT CT ABD-PELV W/ CM
2 of 4 series · 16 of 46 positions shown, 18 images · IV contrast (Omnipaque)
Comparison: None.

CLINICAL DATA: Right lower quadrant abdominal pain, nausea

EXAM:
CT ABDOMEN AND PELVIS WITH CONTRAST
TECHNIQUE: Multidetector CT imaging of the abdomen and pelvis was performed
using the standard protocol following bolus administration of
intravenous contrast.
CONTRAST:  100mL OMNIPAQUE IOHEXOL 300 MG/ML  SOLN

[Series 2: axial st · axial · 0.75mm/px · z∈[+371,+841]mm · 13 of 104 slices shown, 15 images]
[im 5/104  soft-tissue]
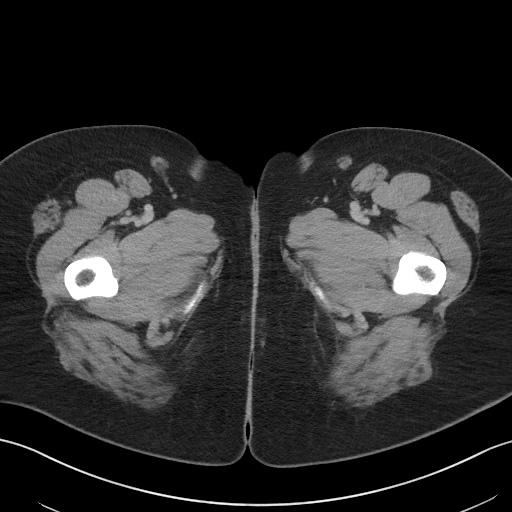
[im 5/104  bone]
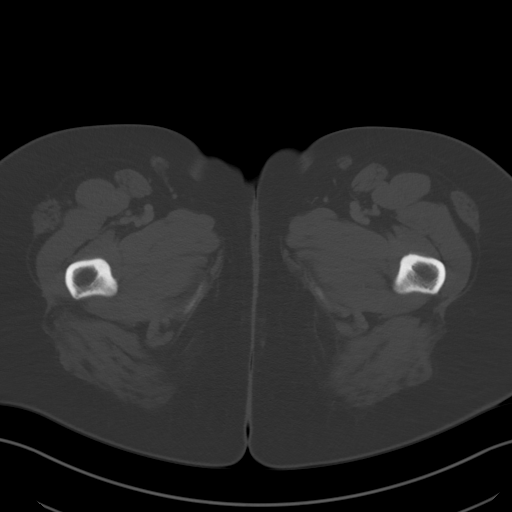
[im 13/104  soft-tissue]
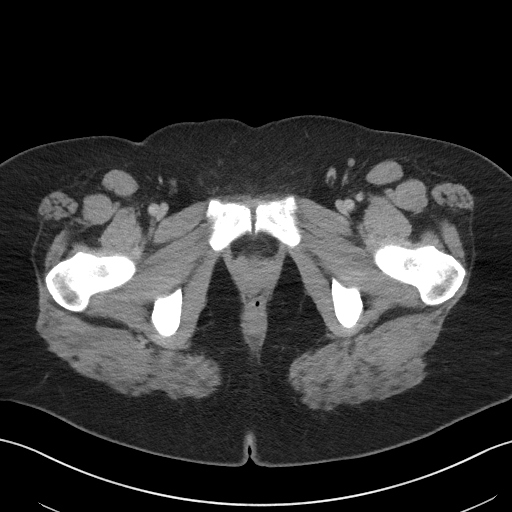
[im 22/104  soft-tissue]
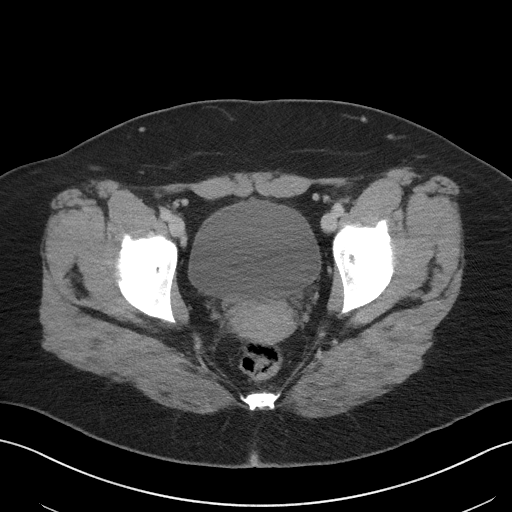
[im 31/104  soft-tissue]
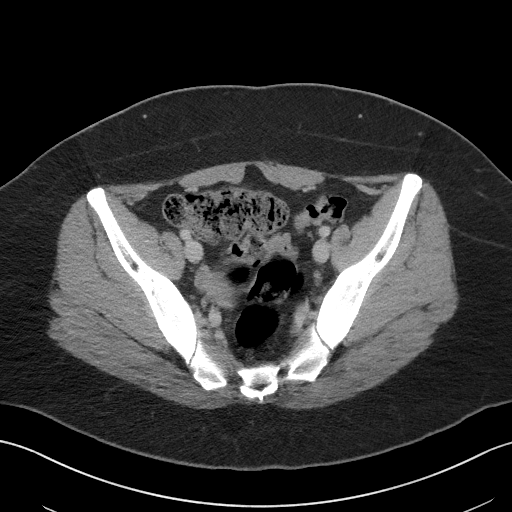
[im 35/104  soft-tissue]
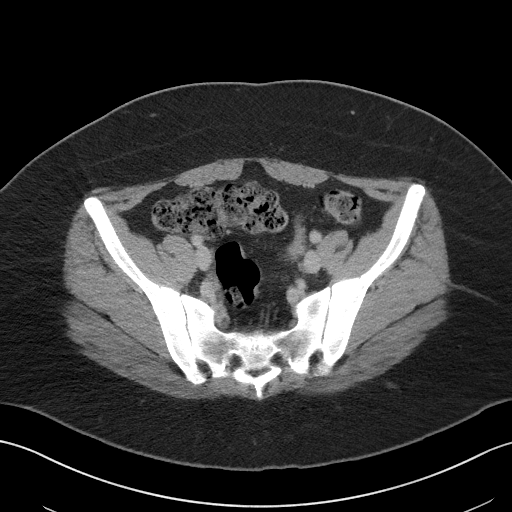
[im 43/104  soft-tissue]
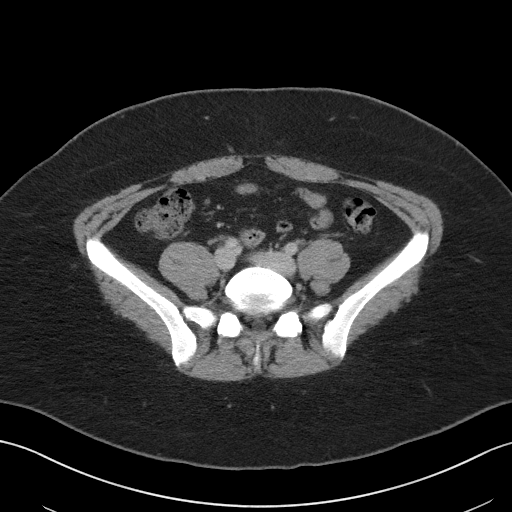
[im 52/104  soft-tissue]
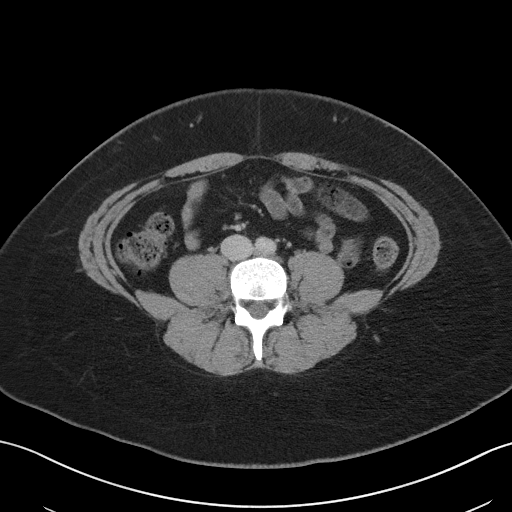
[im 61/104  soft-tissue]
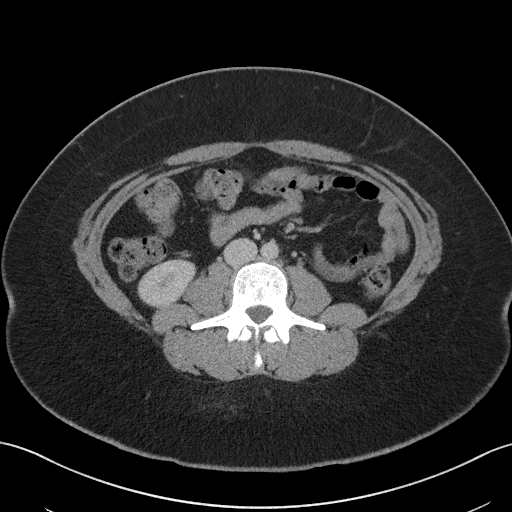
[im 69/104  soft-tissue]
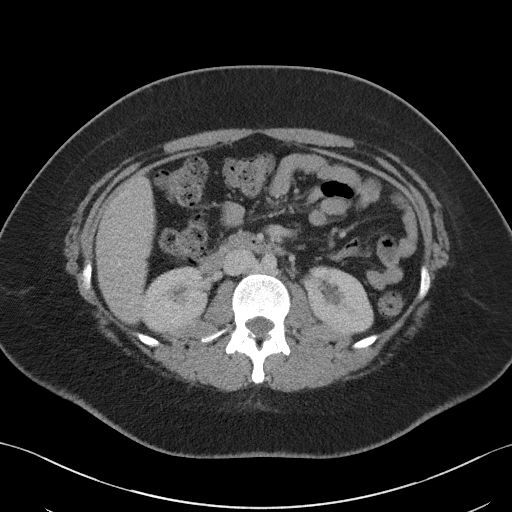
[im 69/104  bone]
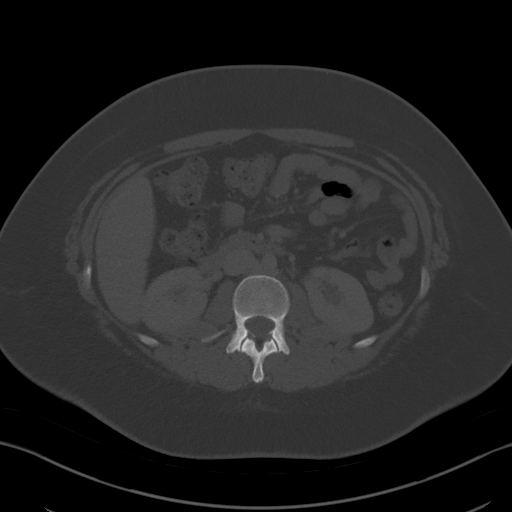
[im 73/104  soft-tissue]
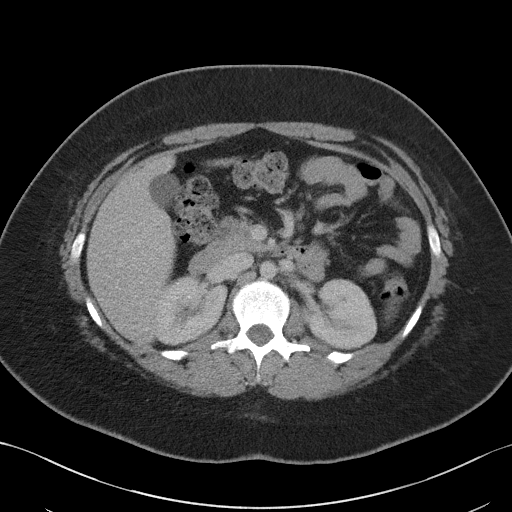
[im 82/104  soft-tissue]
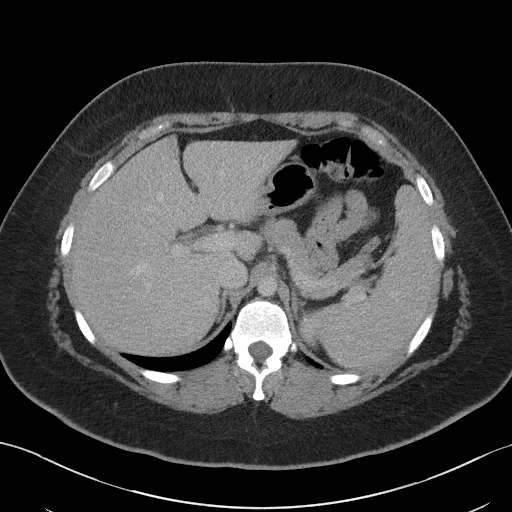
[im 91/104  soft-tissue]
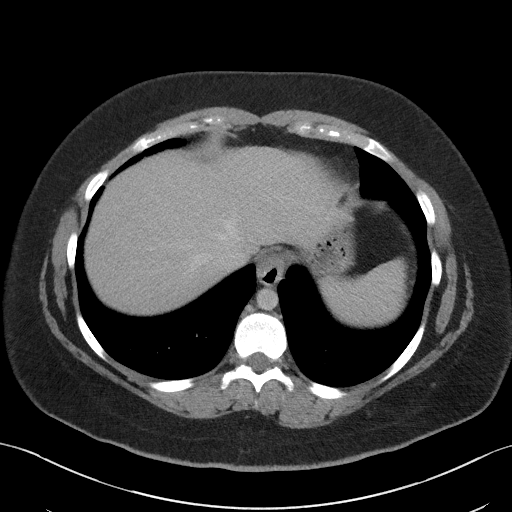
[im 99/104  soft-tissue]
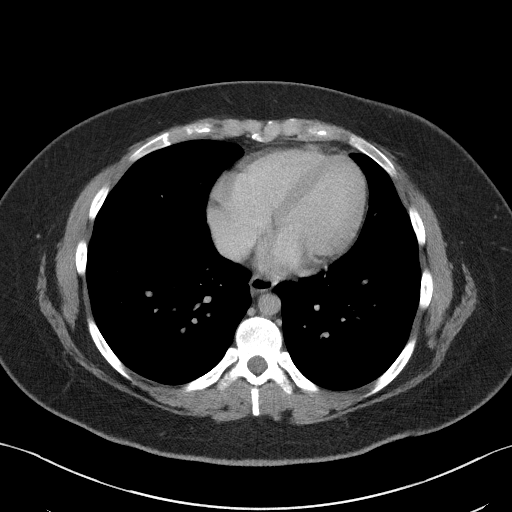

[Series 5: coronal st · coronal · 0.80mm/px · 3 of 100 slices shown]
[im 34/100  soft-tissue]
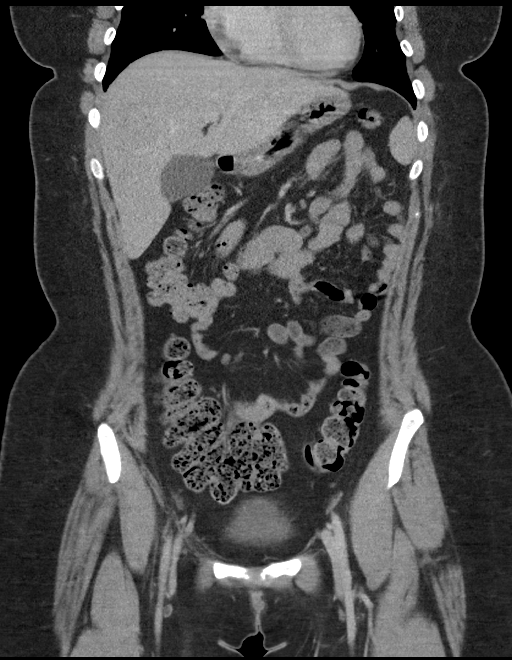
[im 45/100  soft-tissue]
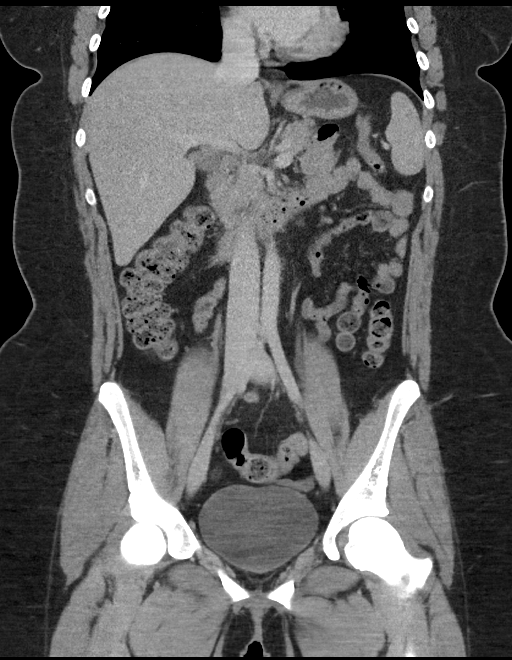
[im 56/100  soft-tissue]
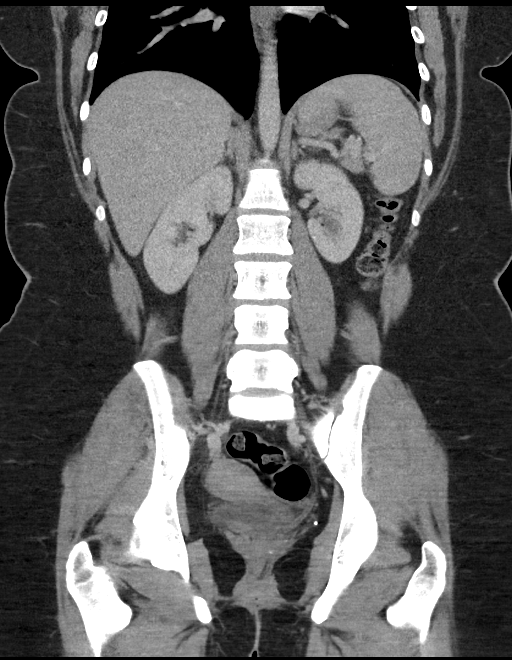

[16 of 46 positions shown; findings below may reference images not displayed]

FINDINGS: Lower chest: No acute pleural or parenchymal lung disease.

Hepatobiliary: No focal liver abnormality is seen. No gallstones,
gallbladder wall thickening, or biliary dilatation.

Pancreas: Unremarkable. No pancreatic ductal dilatation or
surrounding inflammatory changes.

Spleen: Normal in size without focal abnormality.

Adrenals/Urinary Tract: Adrenal glands are unremarkable. Kidneys are
normal, without renal calculi, focal lesion, or hydronephrosis.
Bladder is unremarkable.

Stomach/Bowel: No bowel obstruction or ileus. There is a normal
appendix within the midline upper pelvis. No bowel wall thickening
or inflammatory change.

Vascular/Lymphatic: No significant vascular findings are present. No
enlarged abdominal or pelvic lymph nodes.

Reproductive: Uterus and bilateral adnexa are unremarkable.

Other: No free fluid or free gas.  No abdominal wall hernia.

Musculoskeletal: No acute or destructive bony lesions. Reconstructed
images demonstrate no additional findings.
IMPRESSION: 1. No acute intra-abdominal or intrapelvic process. Normal appendix.
